# Patient Record
Sex: Male | Born: 2000 | Race: White | Hispanic: No | Marital: Single | Smoking: Never smoker
Health system: Southern US, Community
[De-identification: ages and names within clinical notes are randomized; demographics above are authoritative.]

## PROBLEM LIST (undated history)

## (undated) DIAGNOSIS — K429 Umbilical hernia without obstruction or gangrene: Secondary | ICD-10-CM

---
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
## 2018-06-26 ENCOUNTER — Other Ambulatory Visit: Payer: Self-pay | Admitting: Pediatrics

## 2018-06-26 DIAGNOSIS — Z136 Encounter for screening for cardiovascular disorders: Secondary | ICD-10-CM

## 2018-06-28 ENCOUNTER — Ambulatory Visit
Admission: RE | Admit: 2018-06-28 | Discharge: 2018-06-28 | Disposition: A | Discharge status: Home / Self Care | Payer: BC Managed Care – PPO | Source: Ambulatory Visit | Attending: Pediatrics | Admitting: Pediatrics

## 2018-06-28 DIAGNOSIS — Z136 Encounter for screening for cardiovascular disorders: Secondary | ICD-10-CM | POA: Diagnosis not present

## 2018-06-28 NOTE — Progress Notes (Signed)
*  PRELIMINARY RESULTS* Echocardiogram 2D Echocardiogram has been performed.  Cristela BlueHege, Roylene Heaton 06/28/2018, 11:49 AM

## 2019-02-15 ENCOUNTER — Emergency Department
Admission: EM | Admit: 2019-02-15 | Discharge: 2019-02-15 | Disposition: A | Discharge status: Home / Self Care | Payer: BC Managed Care – PPO | Attending: Student in an Organized Health Care Education/Training Program | Admitting: Student in an Organized Health Care Education/Training Program

## 2019-02-15 ENCOUNTER — Encounter: Payer: Self-pay | Admitting: Emergency Medicine

## 2019-02-15 ENCOUNTER — Emergency Department: Payer: BC Managed Care – PPO

## 2019-02-15 ENCOUNTER — Other Ambulatory Visit: Payer: Self-pay

## 2019-02-15 DIAGNOSIS — K429 Umbilical hernia without obstruction or gangrene: Secondary | ICD-10-CM | POA: Diagnosis not present

## 2019-02-15 DIAGNOSIS — R1084 Generalized abdominal pain: Secondary | ICD-10-CM | POA: Diagnosis present

## 2019-02-15 DIAGNOSIS — Z79899 Other long term (current) drug therapy: Secondary | ICD-10-CM | POA: Diagnosis not present

## 2019-02-15 LAB — URINALYSIS, COMPLETE (UACMP) WITH MICROSCOPIC
Bacteria, UA: NONE SEEN
Bilirubin Urine: NEGATIVE
Glucose, UA: NEGATIVE mg/dL
Hgb urine dipstick: NEGATIVE
Ketones, ur: NEGATIVE mg/dL
Leukocytes,Ua: NEGATIVE
Nitrite: NEGATIVE
Protein, ur: 30 mg/dL — AB
Specific Gravity, Urine: 1.024 (ref 1.005–1.030)
Squamous Epithelial / HPF: NONE SEEN (ref 0–5)
pH: 6 (ref 5.0–8.0)

## 2019-02-15 LAB — CBC
HCT: 46.6 % (ref 39.0–52.0)
Hemoglobin: 16.4 g/dL (ref 13.0–17.0)
MCH: 29.7 pg (ref 26.0–34.0)
MCHC: 35.2 g/dL (ref 30.0–36.0)
MCV: 84.3 fL (ref 80.0–100.0)
Platelets: 204 10*3/uL (ref 150–400)
RBC: 5.53 MIL/uL (ref 4.22–5.81)
RDW: 11.9 % (ref 11.5–15.5)
WBC: 6.9 10*3/uL (ref 4.0–10.5)
nRBC: 0 % (ref 0.0–0.2)

## 2019-02-15 LAB — COMPREHENSIVE METABOLIC PANEL
ALT: 13 U/L (ref 0–44)
AST: 19 U/L (ref 15–41)
Albumin: 5.6 g/dL — ABNORMAL HIGH (ref 3.5–5.0)
Alkaline Phosphatase: 114 U/L (ref 38–126)
Anion gap: 11 (ref 5–15)
BUN: 15 mg/dL (ref 6–20)
CO2: 26 mmol/L (ref 22–32)
Calcium: 9.9 mg/dL (ref 8.9–10.3)
Chloride: 104 mmol/L (ref 98–111)
Creatinine, Ser: 0.82 mg/dL (ref 0.61–1.24)
GFR calc Af Amer: >60 mL/min (ref >60)
GFR calc non Af Amer: >60 mL/min (ref >60)
Glucose, Bld: 156 mg/dL — ABNORMAL HIGH (ref 70–99)
Potassium: 3.9 mmol/L (ref 3.5–5.1)
Sodium: 141 mmol/L (ref 135–145)
Total Bilirubin: 0.7 mg/dL (ref 0.3–1.2)
Total Protein: 8.4 g/dL — ABNORMAL HIGH (ref 6.5–8.1)

## 2019-02-15 LAB — LIPASE, BLOOD: Lipase: 26 U/L (ref 11–51)

## 2019-02-15 MED ORDER — HYDROCODONE-ACETAMINOPHEN 5-325 MG PO TABS: 1.0000 | ORAL_TABLET | ORAL | 0 refills | Status: DC | PRN

## 2019-02-15 MED ORDER — POLYETHYLENE GLYCOL 3350 17 G PO PACK: 17.0000 g | PACK | Freq: Every day | ORAL | 0 refills | Status: DC

## 2019-02-15 NOTE — Discharge Instructions (Addendum)

## 2019-02-15 NOTE — ED Triage Notes (Signed)
Pt report pain just below umbilicus that started Wednesday; pt anxious in triage, says "i'm scared" as he's never been to the ED for himself before;  pt says pain is constant but varies in severity; some nausea, no vomiting or diarrhea; last bowel movement at 11pm last night-normal; reports urinary frequency but no pain;

## 2019-02-15 NOTE — ED Provider Notes (Signed)
[facility] Emergency Department Provider Note    First MD Initiated Contact with Patient 02/15/19 [phone]     (approximate)  I have reviewed the triage vital signs and the nursing notes.   HISTORY  Chief Complaint Abdominal Pain    HPI Kyle Clarke is a 18 y.o. male no significant past medical history presents the ER for 2 days of progressively worsening periumbilical pain that does radiate to the epigastric region as well as right lower quadrant.  Patient is never had pain like this before.  States it is worsened with palpation of the periumbilical area.  Describes it as a burning sensation.  Denies any fevers.  No nausea or vomiting.  No diarrhea.  No constipation.  No previous surgeries.    History reviewed. No pertinent past medical history. History reviewed. No pertinent family history. History reviewed. No pertinent surgical history. Patient Active Problem List   Diagnosis Date Noted  . Right ankle pain 04/07/2016      Prior to Admission medications   Medication Sig Start Date End Date Taking? Authorizing Provider  famotidine (PEPCID) 10 MG tablet Take 10 mg by mouth as needed for heartburn or indigestion.   Yes [provider]  hyoscyamine (LEVSIN SL) 0.125 MG SL tablet Take 1-2 tablets by mouth every 6 (six) hours. 02/06/19  Yes [provider]  omeprazole (PRILOSEC) 20 MG capsule Take 20 mg by mouth as needed.   Yes [provider]  HYDROcodone-acetaminophen (NORCO) 5-325 MG tablet Take 1 tablet by mouth every 4 (four) hours as needed for moderate pain. 02/15/19   Willy Eddy, MD  polyethylene glycol (MIRALAX / GLYCOLAX) 17 g packet Take 17 g by mouth daily. Mix one tablespoon with 8oz of your favorite juice or water every day until you are having soft formed stools. Then start taking once daily if you didn't have a stool the day before. 02/15/19   Willy Eddy, MD    Allergies Patient has no known  allergies.    Social History Social History   Tobacco Use  . Smoking status: Never Smoker  . Smokeless tobacco: Never Used  Substance Use Topics  . Alcohol use: Never    Alcohol/week: 0.0 standard drinks    Frequency: Never  . Drug use: Never    Review of Systems Patient denies headaches, rhinorrhea, blurry vision, numbness, shortness of breath, chest pain, edema, cough, abdominal pain, nausea, vomiting, diarrhea, dysuria, fevers, rashes or hallucinations unless otherwise stated above in HPI. ____________________________________________   PHYSICAL EXAM:  VITAL SIGNS: Vitals:   02/15/19 0358  BP: (!) 157/96  Pulse: (!) 127  Resp: 19  Temp: 97.8 F (36.6 C)  SpO2: 100%    Constitutional: Alert and oriented.  Eyes: Conjunctivae are normal.  Head: Atraumatic. Nose: No congestion/rhinnorhea. Mouth/Throat: Mucous membranes are moist.   Neck: No stridor. Painless ROM.  Cardiovascular: Normal rate, regular rhythm. Grossly normal heart sounds.  Good peripheral circulation. Respiratory: Normal respiratory effort.  No retractions. Lungs CTAB. Gastrointestinal: Soft and tender small infraumbilical reducible hernia.  No overlying erythema.  No tenderness on remainder of abdominal exam. . No distention. No abdominal bruits. No CVA tenderness. Genitourinary: deferred Musculoskeletal: No lower extremity tenderness nor edema.  No joint effusions. Neurologic:  Normal speech and language. No gross focal neurologic deficits are appreciated. No facial droop Skin:  Skin is warm, dry and intact. No rash noted. Psychiatric: Mood and affect are anxious Speech and behavior are normal.  ____________________________________________  LABS (all labs ordered are listed, but only abnormal results are displayed)  Results for orders placed or performed during the hospital encounter of 02/15/19 (from the past 24 hour(s))  Lipase, blood     Status: None   Collection Time: 02/15/19  4:02 AM   Result Value Ref Range   Lipase 26 11 - 51 U/L  Comprehensive metabolic panel     Status: Abnormal   Collection Time: 02/15/19  4:02 AM  Result Value Ref Range   Sodium 141 135 - 145 mmol/L   Potassium 3.9 3.5 - 5.1 mmol/L   Chloride 104 98 - 111 mmol/L   CO2 26 22 - 32 mmol/L   Glucose, Bld 156 (H) 70 - 99 mg/dL   BUN 15 6 - 20 mg/dL   Creatinine, Ser 1.61 0.61 - 1.24 mg/dL   Calcium 9.9 8.9 - 09.6 mg/dL   Total Protein 8.4 (H) 6.5 - 8.1 g/dL   Albumin 5.6 (H) 3.5 - 5.0 g/dL   AST 19 15 - 41 U/L   ALT 13 0 - 44 U/L   Alkaline Phosphatase 114 38 - 126 U/L   Total Bilirubin 0.7 0.3 - 1.2 mg/dL   GFR calc non Af Amer >60 >60 mL/min   GFR calc Af Amer >60 >60 mL/min   Anion gap 11 5 - 15  CBC     Status: None   Collection Time: 02/15/19  4:02 AM  Result Value Ref Range   WBC 6.9 4.0 - 10.5 K/uL   RBC 5.53 4.22 - 5.81 MIL/uL   Hemoglobin 16.4 13.0 - 17.0 g/dL   HCT 04.5 40.9 - 81.1 %   MCV 84.3 80.0 - 100.0 fL   MCH 29.7 26.0 - 34.0 pg   MCHC 35.2 30.0 - 36.0 g/dL   RDW 91.4 78.2 - 95.6 %   Platelets 204 150 - 400 K/uL   nRBC 0.0 0.0 - 0.2 %  Urinalysis, Complete w Microscopic     Status: Abnormal   Collection Time: 02/15/19  4:08 AM  Result Value Ref Range   Color, Urine YELLOW (A) YELLOW   APPearance CLEAR (A) CLEAR   Specific Gravity, Urine 1.024 1.005 - 1.030   pH 6.0 5.0 - 8.0   Glucose, UA NEGATIVE NEGATIVE mg/dL   Hgb urine dipstick NEGATIVE NEGATIVE   Bilirubin Urine NEGATIVE NEGATIVE   Ketones, ur NEGATIVE NEGATIVE mg/dL   Protein, ur 30 (A) NEGATIVE mg/dL   Nitrite NEGATIVE NEGATIVE   Leukocytes,Ua NEGATIVE NEGATIVE   RBC / HPF 0-5 0 - 5 RBC/hpf   WBC, UA 0-5 0 - 5 WBC/hpf   Bacteria, UA NONE SEEN NONE SEEN   Squamous Epithelial / LPF NONE SEEN 0 - 5   Mucus PRESENT    ____________________________________________ ____________________________________________  RADIOLOGY  I personally reviewed all radiographic images ordered to evaluate for the above  acute complaints and reviewed radiology reports and findings.  These findings were personally discussed with the patient.  Please see medical record for radiology report.  ____________________________________________   PROCEDURES  Procedure(s) performed:  Procedures    Critical Care performed: no ____________________________________________   INITIAL IMPRESSION / ASSESSMENT AND PLAN / ED COURSE  Pertinent labs & imaging results that were available during my care of the patient were reviewed by me and considered in my medical decision making (see chart for details).   DDX: Hernia, appendicitis, musculoskeletal strain, colitis, constipation, stone  Kyle Clarke is a 18 y.o. who presents to the ED  with symptoms as described above.  Patient nontoxic-appearing ambulating about the room in no acute distress.  Certainly no peritonitis.  Exam is concerning for periumbilical hernia that is reducible.  Will order ultrasound as he does have some report of right lower quadrant referred pain.  Given lack of leukocytosis do not feel that CT imaging clinically indicated as I would expect some form of fever or white count elevation after 2 days of pain if this were related to appendicitis.  Clinical Course as of Feb 15 603  Sat Feb 15, 2019  0604 Repeat abdominal exam soft benign.  Ultrasound does show evidence of small fat-containing hernia that is reducible.  No evidence of SBO.  Not consistent with acute appendicitis.  Patient will be given symptomatic management as well as referral to surgery.  Discussed signs and symptoms for which she should return to the ER.   [PR]    Clinical Course User Index [PR] Willy Eddyobinson, Burnett Lieber, MD    The patient was evaluated in Emergency Department today for the symptoms described in the history of present illness. He/she was evaluated in the context of the global COVID-19 pandemic, which necessitated consideration that the patient might be at risk for infection  with the SARS-CoV-2 virus that causes COVID-19. Institutional protocols and algorithms that pertain to the evaluation of patients at risk for COVID-19 are in a state of rapid change based on information released by regulatory bodies including the CDC and federal and state organizations. These policies and algorithms were followed during the patient's care in the ED.  As part of my medical decision making, I reviewed the following data within the electronic MEDICAL RECORD NUMBER Nursing notes reviewed and incorporated, Labs reviewed, notes from prior ED visits and [place] Controlled Substance Database   ____________________________________________   FINAL CLINICAL IMPRESSION(S) / ED DIAGNOSES  Final diagnoses:  Periumbilical hernia  Generalized abdominal pain      NEW MEDICATIONS STARTED DURING THIS VISIT:  New Prescriptions   HYDROCODONE-ACETAMINOPHEN (NORCO) 5-325 MG TABLET    Take 1 tablet by mouth every 4 (four) hours as needed for moderate pain.   POLYETHYLENE GLYCOL (MIRALAX / GLYCOLAX) 17 G PACKET    Take 17 g by mouth daily. Mix one tablespoon with 8oz of your favorite juice or water every day until you are having soft formed stools. Then start taking once daily if you didn't have a stool the day before.     Note:  This document was prepared using Dragon voice recognition software and may include unintentional dictation errors.    Willy Eddyobinson, Jeffery Gammell, MD 02/15/19 [phone]

## 2019-02-15 NOTE — ED Notes (Signed)
Pt says he's unable to void even just a little at this time as he went before coming to the ED; given specimen cup and encouraged to go as soon as possible

## 2019-02-17 ENCOUNTER — Other Ambulatory Visit: Payer: Self-pay

## 2019-02-17 ENCOUNTER — Encounter: Payer: Self-pay | Admitting: Anesthesiology

## 2019-02-17 ENCOUNTER — Ambulatory Visit: Payer: BC Managed Care – PPO | Admitting: Anesthesiology

## 2019-02-17 ENCOUNTER — Ambulatory Visit
Admission: RE | Admit: 2019-02-17 | Discharge: 2019-02-17 | Disposition: A | Discharge status: Home / Self Care | Payer: BC Managed Care – PPO | Source: Ambulatory Visit | Attending: Surgery | Admitting: Surgery

## 2019-02-17 ENCOUNTER — Ambulatory Visit: Payer: Self-pay | Admitting: Surgery

## 2019-02-17 ENCOUNTER — Encounter
Admission: RE | Disposition: A | Discharge status: Home / Self Care | Payer: Self-pay | Source: Ambulatory Visit | Attending: Surgery

## 2019-02-17 DIAGNOSIS — K421 Umbilical hernia with gangrene: Secondary | ICD-10-CM | POA: Insufficient documentation

## 2019-02-17 DIAGNOSIS — Z79899 Other long term (current) drug therapy: Secondary | ICD-10-CM | POA: Insufficient documentation

## 2019-02-17 DIAGNOSIS — K420 Umbilical hernia with obstruction, without gangrene: Secondary | ICD-10-CM

## 2019-02-17 HISTORY — DX: Umbilical hernia without obstruction or gangrene: K42.9

## 2019-02-17 HISTORY — PX: UMBILICAL HERNIA REPAIR: SHX196

## 2019-02-17 SURGERY — REPAIR, HERNIA, UMBILICAL, ADULT
Anesthesia: General

## 2019-02-17 MED ORDER — SUGAMMADEX SODIUM 200 MG/2ML IV SOLN
INTRAVENOUS | Status: DC | PRN
  Administered 2019-02-17: 140 mg via INTRAVENOUS

## 2019-02-17 MED ORDER — IBUPROFEN 800 MG PO TABS: 800.0000 mg | ORAL_TABLET | Freq: Three times a day (TID) | ORAL | 0 refills | Status: DC | PRN

## 2019-02-17 MED ORDER — LACTATED RINGERS IV SOLN
INTRAVENOUS | Status: DC
  Administered 2019-02-17: 19:00:00 via INTRAVENOUS

## 2019-02-17 MED ORDER — BUPIVACAINE-EPINEPHRINE (PF) 0.5% -1:200000 IJ SOLN
INTRAMUSCULAR | Status: DC | PRN
  Administered 2019-02-17: 3 mL via PERINEURAL

## 2019-02-17 MED ORDER — ONDANSETRON HCL 4 MG/2ML IJ SOLN: 4.0000 mg | Freq: Once | INTRAMUSCULAR | Status: DC | PRN

## 2019-02-17 MED ORDER — BUPIVACAINE-EPINEPHRINE (PF) 0.5% -1:200000 IJ SOLN
INTRAMUSCULAR | Status: AC
  Filled 2019-02-17: qty 30

## 2019-02-17 MED ORDER — GABAPENTIN 300 MG PO CAPS: 300.0000 mg | ORAL_CAPSULE | ORAL | Status: DC

## 2019-02-17 MED ORDER — DEXAMETHASONE SODIUM PHOSPHATE 10 MG/ML IJ SOLN
INTRAMUSCULAR | Status: DC | PRN
  Administered 2019-02-17: 10 mg via INTRAVENOUS

## 2019-02-17 MED ORDER — DOCUSATE SODIUM 100 MG PO CAPS: 100.0000 mg | ORAL_CAPSULE | Freq: Two times a day (BID) | ORAL | 0 refills | Status: AC | PRN

## 2019-02-17 MED ORDER — CEFAZOLIN SODIUM-DEXTROSE 2-4 GM/100ML-% IV SOLN
INTRAVENOUS | Status: AC
  Filled 2019-02-17: qty 100

## 2019-02-17 MED ORDER — BUPIVACAINE LIPOSOME 1.3 % IJ SUSP
INTRAMUSCULAR | Status: DC | PRN
  Administered 2019-02-17: 20 mL

## 2019-02-17 MED ORDER — HYDROCODONE-ACETAMINOPHEN 5-325 MG PO TABS: 1.0000 | ORAL_TABLET | Freq: Four times a day (QID) | ORAL | 0 refills | Status: AC | PRN

## 2019-02-17 MED ORDER — BUPIVACAINE HCL (PF) 0.5 % IJ SOLN
INTRAMUSCULAR | Status: AC
  Filled 2019-02-17: qty 30

## 2019-02-17 MED ORDER — MIDAZOLAM HCL 2 MG/2ML IJ SOLN
INTRAMUSCULAR | Status: DC | PRN
  Administered 2019-02-17: 2 mg via INTRAVENOUS

## 2019-02-17 MED ORDER — CELECOXIB 200 MG PO CAPS
ORAL_CAPSULE | ORAL | Status: AC
  Filled 2019-02-17: qty 1

## 2019-02-17 MED ORDER — SUCCINYLCHOLINE CHLORIDE 20 MG/ML IJ SOLN
INTRAMUSCULAR | Status: DC | PRN
  Administered 2019-02-17: 100 mg via INTRAVENOUS

## 2019-02-17 MED ORDER — BUPIVACAINE LIPOSOME 1.3 % IJ SUSP
INTRAMUSCULAR | Status: AC
  Filled 2019-02-17: qty 20

## 2019-02-17 MED ORDER — ONDANSETRON HCL 4 MG/2ML IJ SOLN
INTRAMUSCULAR | Status: DC | PRN
  Administered 2019-02-17: 4 mg via INTRAVENOUS

## 2019-02-17 MED ORDER — LIDOCAINE HCL (CARDIAC) PF 100 MG/5ML IV SOSY
PREFILLED_SYRINGE | INTRAVENOUS | Status: DC | PRN
  Administered 2019-02-17: 100 mg via INTRAVENOUS

## 2019-02-17 MED ORDER — DEXAMETHASONE SODIUM PHOSPHATE 10 MG/ML IJ SOLN
INTRAMUSCULAR | Status: AC
  Filled 2019-02-17: qty 1

## 2019-02-17 MED ORDER — CELECOXIB 200 MG PO CAPS
200.0000 mg | ORAL_CAPSULE | ORAL | Status: AC
  Administered 2019-02-17: 200 mg via ORAL

## 2019-02-17 MED ORDER — ACETAMINOPHEN 500 MG PO TABS
1000.0000 mg | ORAL_TABLET | ORAL | Status: AC
  Administered 2019-02-17: 17:00:00 1000 mg via ORAL

## 2019-02-17 MED ORDER — FENTANYL CITRATE (PF) 100 MCG/2ML IJ SOLN
INTRAMUSCULAR | Status: AC
  Filled 2019-02-17: qty 2

## 2019-02-17 MED ORDER — FENTANYL CITRATE (PF) 100 MCG/2ML IJ SOLN: 25.0000 ug | INTRAMUSCULAR | Status: DC | PRN

## 2019-02-17 MED ORDER — PROPOFOL 10 MG/ML IV BOLUS
INTRAVENOUS | Status: DC | PRN
  Administered 2019-02-17: 150 mg via INTRAVENOUS

## 2019-02-17 MED ORDER — GABAPENTIN 300 MG PO CAPS
ORAL_CAPSULE | ORAL | Status: AC
  Administered 2019-02-17: 17:00:00 300 mg
  Filled 2019-02-17: qty 1

## 2019-02-17 MED ORDER — SUGAMMADEX SODIUM 200 MG/2ML IV SOLN
INTRAVENOUS | Status: AC
  Filled 2019-02-17: qty 2

## 2019-02-17 MED ORDER — ACETAMINOPHEN 325 MG PO TABS: 650.0000 mg | ORAL_TABLET | Freq: Three times a day (TID) | ORAL | 0 refills | Status: AC | PRN

## 2019-02-17 MED ORDER — FENTANYL CITRATE (PF) 100 MCG/2ML IJ SOLN
INTRAMUSCULAR | Status: DC | PRN
  Administered 2019-02-17 (×2): 50 ug via INTRAVENOUS

## 2019-02-17 MED ORDER — ACETAMINOPHEN 500 MG PO TABS
ORAL_TABLET | ORAL | Status: AC
  Filled 2019-02-17: qty 2

## 2019-02-17 MED ORDER — ONDANSETRON HCL 4 MG/2ML IJ SOLN
INTRAMUSCULAR | Status: AC
  Filled 2019-02-17: qty 2

## 2019-02-17 MED ORDER — CHLORHEXIDINE GLUCONATE CLOTH 2 % EX PADS: 6.0000 | MEDICATED_PAD | Freq: Once | CUTANEOUS | Status: DC

## 2019-02-17 MED ORDER — FAMOTIDINE 20 MG PO TABS
ORAL_TABLET | ORAL | Status: AC
  Filled 2019-02-17: qty 1

## 2019-02-17 MED ORDER — ROCURONIUM BROMIDE 100 MG/10ML IV SOLN
INTRAVENOUS | Status: DC | PRN
  Administered 2019-02-17: 40 mg via INTRAVENOUS

## 2019-02-17 MED ORDER — MIDAZOLAM HCL 2 MG/2ML IJ SOLN
INTRAMUSCULAR | Status: AC
  Filled 2019-02-17: qty 2

## 2019-02-17 MED ORDER — CEFAZOLIN SODIUM-DEXTROSE 2-4 GM/100ML-% IV SOLN
2.0000 g | INTRAVENOUS | Status: AC
  Administered 2019-02-17: 19:00:00 2 g via INTRAVENOUS

## 2019-02-17 MED ORDER — FAMOTIDINE 20 MG PO TABS
20.0000 mg | ORAL_TABLET | Freq: Once | ORAL | Status: AC
  Administered 2019-02-17: 20 mg via ORAL

## 2019-02-17 SURGICAL SUPPLY — 34 items
ADH SKN CLS APL DERMABOND .7 (GAUZE/BANDAGES/DRESSINGS) ×1
APL PRP STRL LF DISP 70% ISPRP (MISCELLANEOUS) ×1
BLADE SURG 15 STRL LF DISP TIS (BLADE) ×1 IMPLANT
BLADE SURG 15 STRL SS (BLADE) ×2
CANISTER SUCT 1200ML W/VALVE (MISCELLANEOUS) ×2 IMPLANT
CHLORAPREP W/TINT 26 (MISCELLANEOUS) ×2 IMPLANT
COVER WAND RF STERILE (DRAPES) ×1 IMPLANT
DERMABOND ADVANCED (GAUZE/BANDAGES/DRESSINGS) ×1
DERMABOND ADVANCED .7 DNX12 (GAUZE/BANDAGES/DRESSINGS) ×1 IMPLANT
DRAPE LAPAROTOMY 77X122 PED (DRAPES) ×2 IMPLANT
ELECT REM PT RETURN 9FT ADLT (ELECTROSURGICAL) ×2
ELECTRODE REM PT RTRN 9FT ADLT (ELECTROSURGICAL) ×1 IMPLANT
GLOVE BIOGEL PI IND STRL 7.0 (GLOVE) ×1 IMPLANT
GLOVE BIOGEL PI INDICATOR 7.0 (GLOVE) ×3
GLOVE SURG SYN 6.5 ES PF (GLOVE) ×6 IMPLANT
GLOVE SURG SYN 6.5 PF PI (GLOVE) ×1 IMPLANT
GOWN STRL REUS W/ TWL LRG LVL3 (GOWN DISPOSABLE) ×3 IMPLANT
GOWN STRL REUS W/TWL LRG LVL3 (GOWN DISPOSABLE) ×6
KIT TURNOVER KIT A (KITS) ×2 IMPLANT
LABEL OR SOLS (LABEL) ×2 IMPLANT
NEEDLE HYPO 22GX1.5 SAFETY (NEEDLE) ×2 IMPLANT
NS IRRIG 500ML POUR BTL (IV SOLUTION) ×2 IMPLANT
PACK BASIN MINOR ARMC (MISCELLANEOUS) ×2 IMPLANT
SUT ETHIBOND NAB MO 7 #0 18IN (SUTURE) ×2 IMPLANT
SUT MNCRL 4-0 (SUTURE) ×2
SUT MNCRL 4-0 27XMFL (SUTURE) ×1
SUT VIC AB 2-0 SH 27 (SUTURE) ×2
SUT VIC AB 2-0 SH 27XBRD (SUTURE) ×1 IMPLANT
SUT VIC AB 3-0 SH 27 (SUTURE) ×2
SUT VIC AB 3-0 SH 27X BRD (SUTURE) ×1 IMPLANT
SUTURE MNCRL 4-0 27XMF (SUTURE) ×1 IMPLANT
SYR 10ML LL (SYRINGE) ×4 IMPLANT
TOWEL OR 17X26 4PK STRL BLUE (TOWEL DISPOSABLE) ×2 IMPLANT
WATER STERILE IRR 1000ML POUR (IV SOLUTION) ×1 IMPLANT

## 2019-02-17 NOTE — H&P (View-Only) (Signed)
Subjective:   CC: Umbilical hernia with gangrene and obstruction [K42.1]  HPI:  Kyle Clarke is a 17 y.o. male who was referred by Roxan Hockey for evaluation of above. Symptoms were first noted 5 days ago. Pain is sharp and intense, confined to the umbilicus, without radiation.  Associated with nothing specific, exacerbated by sitting  Lump is not noticeable. Patient has no symptoms of  difficulty urinating. No other associated symptoms.   He has always had a sensitive umbilicus and point tenderness in the area has been an issue for years.  This most recent episode is the worst and seems to not get any better even with narcotics.  Past Medical History: none reported  Past Surgical History: none reported  Family History: family history includes Diabetes in his father; High blood pressure (Hypertension) in his father, paternal grandfather, and paternal grandmother; No Known Problems in his mother; Skin cancer in his father and paternal grandfather.  Social History:  reports that he has never smoked. He has never used smokeless tobacco. He reports that he does not drink alcohol. No history on file for drug.  Current Medications: has a current medication list which includes the following prescription(s): hydrocodone-acetaminophen and hyoscyamine.  Allergies:  Allergies as of 02/17/2019  . (No Known Allergies)    ROS:  General: Denies weight loss, weight gain, fatigue, fevers, chills, and night sweats. Eyes: Denies blurry vision, double vision, eye pain, itchy eyes, and tearing. Ears: Denies hearing loss, earache, and ringing in ears. Nose: Denies sinus pain, congestion, infections, runny nose, and nosebleeds. Mouth/throat: Denies hoarseness, sore throat, bleeding gums, and difficulty swallowing. Heart: Denies chest pain, palpitations, racing heart, irregular heartbeat, leg pain or swelling, and decreased activity tolerance. Respiratory: Denies breathing difficulty, shortness of  breath, wheezing, cough, and sputum. GI: Denies change in appetite, heartburn, nausea, vomiting, constipation, diarrhea, and blood in stool. GU: Denies difficulty urinating, pain with urinating, urgency, frequency, blood in urine, and heavy menstrual bleeding. Musculoskeletal: Denies joint stiffness, pain, swelling, muscle weakness, and pain. Skin: Denies rash, itching, mass, tumors, sores, and boils Neurologic: Denies headache, fainting, dizziness, seizures, numbness, and tingling. Psychiatric: Denies depression, anxiety, difficulty sleeping, and memory loss. Endocrine: Denies heat or cold intolerance, and increased thirst or urination. Blood/lymph: Denies easy bruising, easy bruising, and swollen glands   Objective:   BP 129/79   Ht 185.4 cm (6\' 1" )   Wt 68 kg (149 lb 14.6 oz)   BMI 19.78 kg/m   Constitutional :  alert, appears stated age, cooperative and no distress  Lymphatics/Throat:  no asymmetry, masses, or scars  Respiratory:  clear to auscultation bilaterally  Cardiovascular:  regular rate and rhythm  Gastrointestinal: surrounding erythema at umbilicus, with very tender nodule on right aspect that is irreducible, consistent with incarcerated, likely strangulated umbilical hernia. Remaining abdomen is non-tender, soft  Musculoskeletal: Steady gait and movement  Skin: Cool and moist  Psychiatric: Normal affect, non-agitated, not confused       LABS:  n/a   RADS: CLINICAL DATA: Periumbilical pain. Evaluate for hernia.  EXAM: ULTRASOUND ABDOMEN LIMITED  COMPARISON: None.  FINDINGS: Focused ultrasound of the paraumbilical region demonstrates a tiny fat containing hernia just inferior to the umbilicus. No herniated bowel.  IMPRESSION: 1. Tiny fat containing paraumbilical hernia.   Electronically Signed By: Obie Dredge M.D. On: 02/15/2019 05:53  Assessment:       Umbilical hernia with gangrene and obstruction [K42.1]  Plan:    1. Umbilical hernia with gangrene and obstruction [Z61.1]  Discussed the risk of surgery including recurrence, which can be up to 50% in the case of incisional or complex hernias, possible use of prosthetic materials (mesh) and the increased risk of mesh infxn if used, bleeding, chronic pain, post-op infxn, post-op SBO or ileus, and possible re-operation to address said risks. The risks of general anesthetic, if used, includes MI, CVA, sudden death or even reaction to anesthetic medications also discussed. Alternatives include continued observation.  Benefits include possible symptom relief, prevention of incarceration, strangulation, enlargement in size over time, and the risk of emergency surgery in the face of strangulation.   Typical post-op recovery time of 3-5 days with 4-6 weeks of activity restrictions were also discussed.  ED return precautions given for sudden increase in pain, size of hernia with accompanying fever, nausea, and/or vomiting.  The patient verbalized understanding and all questions were answered to the patient's satisfaction.   2. Patient has elected to proceed with surgical treatment. Procedure will be urgently scheduled.       Electronically signed by Sung AmabileSakai, Amauri Medellin, DO on 02/17/2019 2:23 PM

## 2019-02-17 NOTE — Anesthesia Preprocedure Evaluation (Signed)
Anesthesia Evaluation  Patient identified by MRN, date of birth, ID band Patient awake    Reviewed: Allergy & Precautions, NPO status , Patient's Chart, lab work & pertinent test results, reviewed documented beta blocker date and time   Airway Mallampati: II  TM Distance: >3 FB     Dental  (+) Chipped   Pulmonary           Cardiovascular      Neuro/Psych    GI/Hepatic   Endo/Other    Renal/GU      Musculoskeletal   Abdominal   Peds  Hematology   Anesthesia Other Findings   Reproductive/Obstetrics                             Anesthesia Physical Anesthesia Plan  ASA: II  Anesthesia Plan: General   Post-op Pain Management:    Induction: Intravenous  PONV Risk Score and Plan:   Airway Management Planned: LMA  Additional Equipment:   Intra-op Plan:   Post-operative Plan:   Informed Consent: I have reviewed the patients History and Physical, chart, labs and discussed the procedure including the risks, benefits and alternatives for the proposed anesthesia with the patient or authorized representative who has indicated his/her understanding and acceptance.       Plan Discussed with: CRNA  Anesthesia Plan Comments:         Anesthesia Quick Evaluation  

## 2019-02-17 NOTE — Discharge Instructions (Signed)
AMBULATORY SURGERY  DISCHARGE INSTRUCTIONS   1) The drugs that you were given will stay in your system until tomorrow so for the next 24 hours you should not:  A) Drive an automobile B) Make any legal decisions C) Drink any alcoholic beverage   2) You may resume regular meals tomorrow.  Today it is better to start with liquids and gradually work up to solid foods.  You may eat anything you prefer, but it is better to start with liquids, then soup and crackers, and gradually work up to solid foods.   3) Please notify your doctor immediately if you have any unusual bleeding, trouble breathing, redness and pain at the surgery site, drainage, fever, or pain not relieved by medication. 4)   5) Your post-operative visit with Dr.                                     is: Date:                        Time:    Please call to schedule your post-operative visit.  6) Additional Instructions:        Hernia repair, Care After This sheet gives you information about how to care for yourself after your procedure. Your health care provider may also give you more specific instructions. If you have problems or questions, contact your health care provider. What can I expect after the procedure? After your procedure, it is common to have the following:  Pain in your abdomen, especially in the incision areas. You will be given medicine to control the pain.  Tiredness. This is a normal part of the recovery process. Your energy level will return to normal over the next several weeks.  Changes in your bowel movements, such as constipation or needing to go more often. Talk with your health care provider about how to manage this. Follow these instructions at home: Medicines   tylenol and advil as needed for discomfort.  Please alternate between the two every four hours as needed for pain.     Use narcotics, if prescribed, only when tylenol and motrin is not enough to control pain.   325-650mg   every 8hrs to max of 4000mg /24hrs (including the 325mg  in every norco dose) for the tylenol.     Advil up to 800mg  per dose every 8hrs as needed for pain.    Do not drive or use heavy machinery while taking prescription pain medicine.  Do not drink alcohol while taking prescription pain medicine.  Incision care     Follow instructions from your health care provider about how to take care of your incision areas. Make sure you: ? Keep your incisions clean and dry. ? Wash your hands with soap and water before and after applying medicine to the areas, and before and after changing your bandage (dressing). If soap and water are not available, use hand sanitizer. ? Change your dressing as told by your health care provider. ? Leave stitches (sutures), skin glue, or adhesive strips in place. These skin closures may need to stay in place for 2 weeks or longer. If adhesive strip edges start to loosen and curl up, you may trim the loose edges. Do not remove adhesive strips completely unless your health care provider tells you to do that.  Do not wear tight clothing over the incisions. Tight clothing may rub  and irritate the incision areas, which may cause the incisions to open.  Do not take baths, swim, or use a hot tub until your health care provider approves. OK TO SHOWER IN 24HRS.    Check your incision area every day for signs of infection. Check for: ? More redness, swelling, or pain. ? More fluid or blood. ? Warmth. ? Pus or a bad smell. Activity  Avoid lifting anything that is heavier than 10 lb (4.5 kg) for 2 weeks or until your health care provider says it is okay.  You may resume normal activities as told by your health care provider. Ask your health care provider what activities are safe for you.  Take rest breaks during the day as needed. Eating and drinking  Follow instructions from your health care provider about what you can eat after surgery.  To prevent or treat  constipation while you are taking prescription pain medicine, your health care provider may recommend that you: ? Drink enough fluid to keep your urine clear or pale yellow. ? Take over-the-counter or prescription medicines. ? Eat foods that are high in fiber, such as fresh fruits and vegetables, whole grains, and beans. ? Limit foods that are high in fat and processed sugars, such as fried and sweet foods. General instructions  Ask your health care provider when you will need an appointment to get your sutures or staples removed.  Keep all follow-up visits as told by your health care provider. This is important. Contact a health care provider if:  You have more redness, swelling, or pain around your incisions.  You have more fluid or blood coming from the incisions.  Your incisions feel warm to the touch.  You have pus or a bad smell coming from your incisions or your dressing.  You have a fever.  You have an incision that breaks open (edges not staying together) after sutures or staples have been removed. Get help right away if:  You develop a rash.  You have chest pain or difficulty breathing.  You have pain or swelling in your legs.  You feel light-headed or you faint.  Your abdomen swells (becomes distended).  You have nausea or vomiting.  You have blood in your stool (feces). This information is not intended to replace advice given to you by your health care provider. Make sure you discuss any questions you have with your health care provider. Document Released: 04/21/2005 Document Revised: 06/21/2018 Document Reviewed: 07/03/2016 Elsevier Interactive Patient Education  2019 [employer].

## 2019-02-17 NOTE — Anesthesia Procedure Notes (Signed)
Procedure Name: Intubation Date/Time: 02/17/2019 6:59 PM Performed by: Lendon Colonel, CRNA Pre-anesthesia Checklist: Patient identified, Patient being monitored, Timeout performed, Emergency Drugs available and Suction available Patient Re-evaluated:Patient Re-evaluated prior to induction Oxygen Delivery Method: Circle system utilized Preoxygenation: Pre-oxygenation with 100% oxygen Induction Type: IV induction Ventilation: Mask ventilation without difficulty Laryngoscope Size: Mac and 3 Grade View: Grade I Tube type: Oral Tube size: 7.5 mm Number of attempts: 1 Airway Equipment and Method: Stylet Placement Confirmation: ETT inserted through vocal cords under direct vision,  positive ETCO2 and breath sounds checked- equal and bilateral Secured at: 22 cm Tube secured with: Tape Dental Injury: Teeth and Oropharynx as per pre-operative assessment

## 2019-02-17 NOTE — Anesthesia Postprocedure Evaluation (Signed)
Anesthesia Post Note  Patient: Kyle Clarke  Procedure(s) Performed: HERNIA REPAIR UMBILICAL ADULT (N/A )  Patient location during evaluation: PACU Anesthesia Type: General Level of consciousness: awake and alert Pain management: pain level controlled Vital Signs Assessment: post-procedure vital signs reviewed and stable Respiratory status: spontaneous breathing, nonlabored ventilation, respiratory function stable and patient connected to nasal cannula oxygen Cardiovascular status: blood pressure returned to baseline and stable Postop Assessment: no apparent nausea or vomiting Anesthetic complications: no     Last Vitals:  Vitals:   02/17/19 2041 02/17/19 2047  BP: (!) 138/95 (!) 133/92  Pulse: 84 92  Resp: 15 18  Temp: 37.1 C 37.1 C  SpO2: 98% 99%    Last Pain:  Vitals:   02/17/19 2047  TempSrc: Temporal  PainSc: 2                  Jakeia Carreras S

## 2019-02-17 NOTE — H&P (Signed)
Subjective:   CC: Umbilical hernia with gangrene and obstruction [K42.1]  HPI:  Kyle Clarke is a 17 y.o. male who was referred by Roxan Hockey for evaluation of above. Symptoms were first noted 5 days ago. Pain is sharp and intense, confined to the umbilicus, without radiation.  Associated with nothing specific, exacerbated by sitting  Lump is not noticeable. Patient has no symptoms of  difficulty urinating. No other associated symptoms.   He has always had a sensitive umbilicus and point tenderness in the area has been an issue for years.  This most recent episode is the worst and seems to not get any better even with narcotics.  Past Medical History: none reported  Past Surgical History: none reported  Family History: family history includes Diabetes in his father; High blood pressure (Hypertension) in his father, paternal grandfather, and paternal grandmother; No Known Problems in his mother; Skin cancer in his father and paternal grandfather.  Social History:  reports that he has never smoked. He has never used smokeless tobacco. He reports that he does not drink alcohol. No history on file for drug.  Current Medications: has a current medication list which includes the following prescription(s): hydrocodone-acetaminophen and hyoscyamine.  Allergies:  Allergies as of 02/17/2019  . (No Known Allergies)    ROS:  General: Denies weight loss, weight gain, fatigue, fevers, chills, and night sweats. Eyes: Denies blurry vision, double vision, eye pain, itchy eyes, and tearing. Ears: Denies hearing loss, earache, and ringing in ears. Nose: Denies sinus pain, congestion, infections, runny nose, and nosebleeds. Mouth/throat: Denies hoarseness, sore throat, bleeding gums, and difficulty swallowing. Heart: Denies chest pain, palpitations, racing heart, irregular heartbeat, leg pain or swelling, and decreased activity tolerance. Respiratory: Denies breathing difficulty, shortness of  breath, wheezing, cough, and sputum. GI: Denies change in appetite, heartburn, nausea, vomiting, constipation, diarrhea, and blood in stool. GU: Denies difficulty urinating, pain with urinating, urgency, frequency, blood in urine, and heavy menstrual bleeding. Musculoskeletal: Denies joint stiffness, pain, swelling, muscle weakness, and pain. Skin: Denies rash, itching, mass, tumors, sores, and boils Neurologic: Denies headache, fainting, dizziness, seizures, numbness, and tingling. Psychiatric: Denies depression, anxiety, difficulty sleeping, and memory loss. Endocrine: Denies heat or cold intolerance, and increased thirst or urination. Blood/lymph: Denies easy bruising, easy bruising, and swollen glands   Objective:   BP 129/79   Ht 185.4 cm (6\' 1" )   Wt 68 kg (149 lb 14.6 oz)   BMI 19.78 kg/m   Constitutional :  alert, appears stated age, cooperative and no distress  Lymphatics/Throat:  no asymmetry, masses, or scars  Respiratory:  clear to auscultation bilaterally  Cardiovascular:  regular rate and rhythm  Gastrointestinal: surrounding erythema at umbilicus, with very tender nodule on right aspect that is irreducible, consistent with incarcerated, likely strangulated umbilical hernia. Remaining abdomen is non-tender, soft  Musculoskeletal: Steady gait and movement  Skin: Cool and moist  Psychiatric: Normal affect, non-agitated, not confused       LABS:  n/a   RADS: CLINICAL DATA: Periumbilical pain. Evaluate for hernia.  EXAM: ULTRASOUND ABDOMEN LIMITED  COMPARISON: None.  FINDINGS: Focused ultrasound of the paraumbilical region demonstrates a tiny fat containing hernia just inferior to the umbilicus. No herniated bowel.  IMPRESSION: 1. Tiny fat containing paraumbilical hernia.   Electronically Signed By: Obie Dredge M.D. On: 02/15/2019 05:53  Assessment:       Umbilical hernia with gangrene and obstruction [K42.1]  Plan:    1. Umbilical hernia with gangrene and obstruction [Z61.1]  Discussed the risk of surgery including recurrence, which can be up to 50% in the case of incisional or complex hernias, possible use of prosthetic materials (mesh) and the increased risk of mesh infxn if used, bleeding, chronic pain, post-op infxn, post-op SBO or ileus, and possible re-operation to address said risks. The risks of general anesthetic, if used, includes MI, CVA, sudden death or even reaction to anesthetic medications also discussed. Alternatives include continued observation.  Benefits include possible symptom relief, prevention of incarceration, strangulation, enlargement in size over time, and the risk of emergency surgery in the face of strangulation.   Typical post-op recovery time of 3-5 days with 4-6 weeks of activity restrictions were also discussed.  ED return precautions given for sudden increase in pain, size of hernia with accompanying fever, nausea, and/or vomiting.  The patient verbalized understanding and all questions were answered to the patient's satisfaction.   2. Patient has elected to proceed with surgical treatment. Procedure will be urgently scheduled.       Electronically signed by Jasun Gasparini, DO on 02/17/2019 2:23 PM     

## 2019-02-17 NOTE — Op Note (Signed)
Preoperative diagnosis: umbilical hernia, strangulated Postoperative diagnosis: umbilical hernia,  incarcerated  Procedure:  Open umbilical hernia repair  Anesthesia: LMA  Surgeon: Sung Amabile  Wound Classification: Clean  Specimen: none  Complications: None  Estimated Blood Loss: minimal  Indications:see HPI  Findings: 1. 1cm x 1cm incarcerated umbilical hernia 2. Tension free repair achieved with suture 3. Adequate hemostasis  Description of procedure: The patient was brought to the operating room and general anesthesia was induced. A time-out was completed verifying correct patient, procedure, site, positioning, and implant(s) and/or special equipment prior to beginning this procedure. Antibiotics were administered prior to making the incision. SCDs placed. The anterior abdominal wall was prepped and draped in the standard sterile fashion.   An infraumbilical incision was made after infusing the preplanned incision with half percent Marcaine.  Dissection carried down to fascia where the umbilical stalk was noted.  The stalk was transected and there was noted to be a 1cm x 1cm umbilical hernia.  The incarcerated preperitoneal fat contents were dissected off the surrounding structures and reduced.  Hemostasis was confirmed prior to reducing the actual contents.  The defect itself was primary closed using 0 Ethibond  x3 in  simple interrupted  fashion.  The fascia as well as the skin incision was then infused with exparel.  After confirming hemostasis, the umbilical stalk was reattached to the abdominal wall using 2-0 Vicryl and the wound was irrigated and closed in a multilayer fashion, using 3-0 Vicryl for the deep dermal layer in an interrupted fashion and running 4-0 Monocryl in a subcuticular fashion.  Wound was then dressed with Dermabond.  Patient was then successfully awakened and transferred to PACU in stable condition.  At the end of the procedure sponge and instrument counts  were correct

## 2019-02-17 NOTE — Transfer of Care (Signed)
Immediate Anesthesia Transfer of Care Note  Patient: Kyle Clarke  Procedure(s) Performed: HERNIA REPAIR UMBILICAL ADULT (N/A )  Patient Location: PACU  Anesthesia Type:General  Level of Consciousness: awake, alert , oriented and patient cooperative  Airway & Oxygen Therapy: Patient Spontanous Breathing and Patient connected to face mask oxygen  Post-op Assessment: Report given to RN and Post -op Vital signs reviewed and stable  Post vital signs: Reviewed and stable  Last Vitals:  Vitals Value Taken Time  BP 134/85 02/17/2019  8:07 PM  Temp 37.1 C 02/17/2019  8:07 PM  Pulse 101 02/17/2019  8:09 PM  Resp 21 02/17/2019  8:09 PM  SpO2 100 % 02/17/2019  8:09 PM  Vitals shown include unvalidated device data.  Last Pain:  Vitals:   02/17/19 1614  TempSrc: Temporal  PainSc: 6          Complications: No apparent anesthesia complications

## 2019-02-17 NOTE — Anesthesia Post-op Follow-up Note (Signed)
Anesthesia QCDR form completed.        

## 2019-02-17 NOTE — Interval H&P Note (Signed)
History and Physical Interval Note:  02/17/2019 5:35 PM  Kyle Clarke  has presented today for surgery, with the diagnosis of umbilical hernia.  The various methods of treatment have been discussed with the patient and family. After consideration of risks, benefits and other options for treatment, the patient has consented to  Procedure(s): HERNIA REPAIR UMBILICAL ADULT (N/A) as a surgical intervention.  The patient's history has been reviewed, patient examined, no change in status, stable for surgery.  I have reviewed the patient's chart and labs.  Questions were answered to the patient's satisfaction.    No change in exam.  We discussed increased risk of covid exposure and if he is a asymptomatic carrier, and increased perioperative risk including respiratory decompensation.  He verbalized understanding and still wishes to proceed.  Chalmers Iddings Tonna Boehringer

## 2019-02-18 ENCOUNTER — Encounter: Payer: Self-pay | Admitting: Surgery

## 2019-03-03 ENCOUNTER — Other Ambulatory Visit: Payer: Self-pay | Admitting: Surgery

## 2019-03-03 ENCOUNTER — Other Ambulatory Visit (HOSPITAL_COMMUNITY): Payer: Self-pay | Admitting: Surgery

## 2019-03-03 DIAGNOSIS — R102 Pelvic and perineal pain: Secondary | ICD-10-CM

## 2019-03-12 ENCOUNTER — Other Ambulatory Visit: Payer: Self-pay | Admitting: Surgery

## 2019-03-12 ENCOUNTER — Other Ambulatory Visit: Payer: Self-pay

## 2019-03-12 ENCOUNTER — Ambulatory Visit
Admission: RE | Admit: 2019-03-12 | Discharge: 2019-03-12 | Disposition: A | Discharge status: Home / Self Care | Payer: BC Managed Care – PPO | Source: Ambulatory Visit | Attending: Surgery | Admitting: Surgery

## 2019-03-12 DIAGNOSIS — K429 Umbilical hernia without obstruction or gangrene: Secondary | ICD-10-CM

## 2019-03-12 DIAGNOSIS — R102 Pelvic and perineal pain: Secondary | ICD-10-CM | POA: Diagnosis not present

## 2019-07-31 IMAGING — US ULTRASOUND ABDOMEN LIMITED
1 series · 11 of 11 positions shown · non-contrast
Comparison: None.

CLINICAL DATA: Periumbilical pain.  Evaluate for hernia.

EXAM:
ULTRASOUND ABDOMEN LIMITED

[Series 1: ultrasound abdomen limited · 11 of 11 slices shown]
[im 1/11]
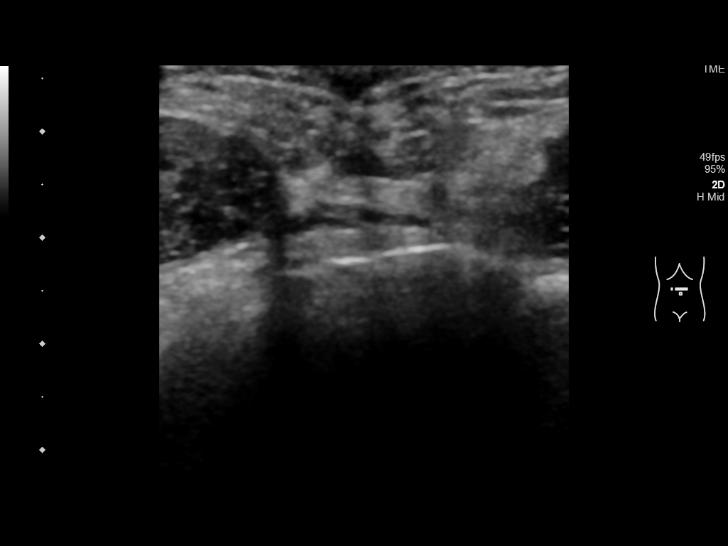
[im 2/11]
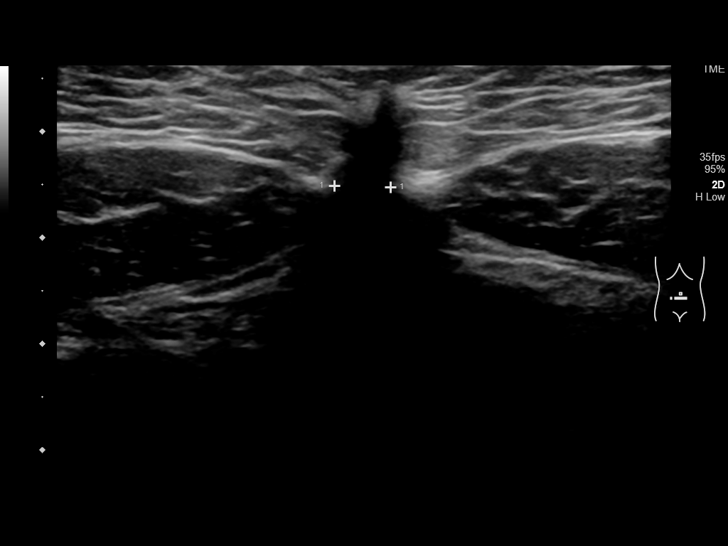
[im 3/11]
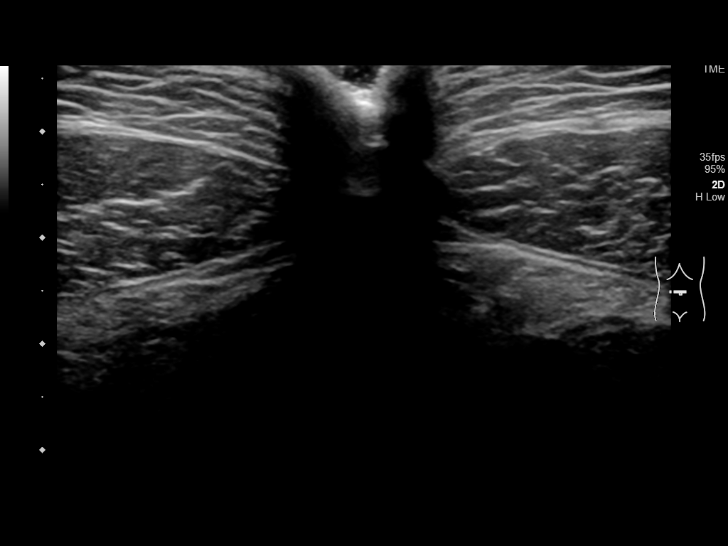
[im 4/11]
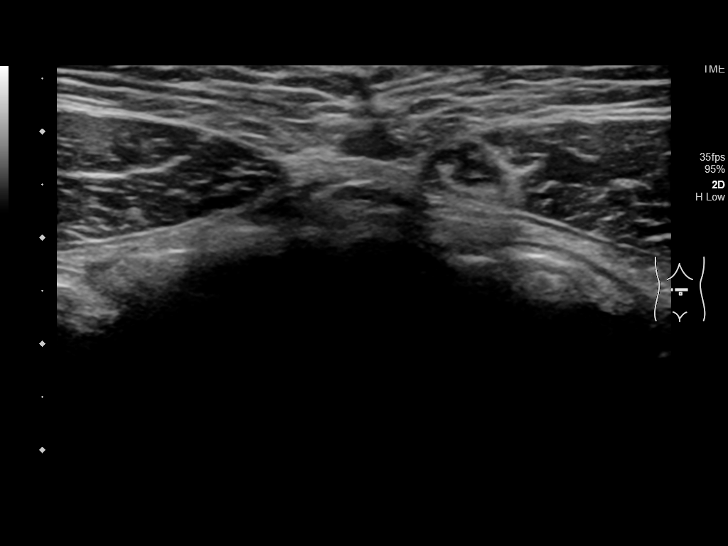
[im 5/11]
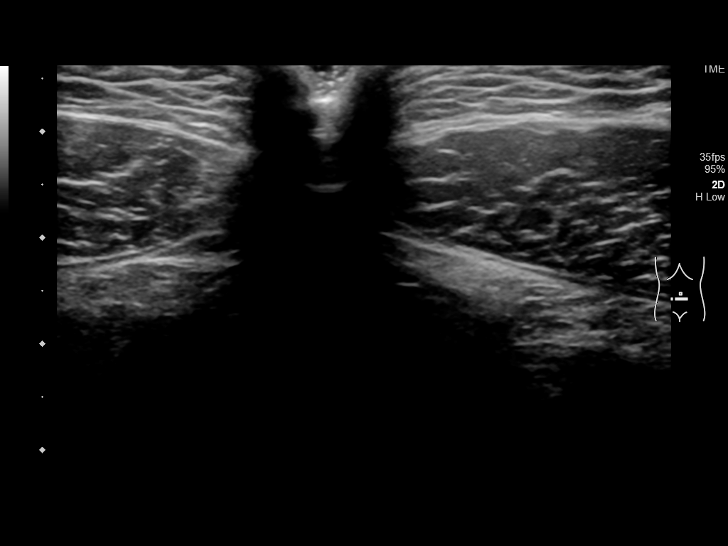
[im 6/11]
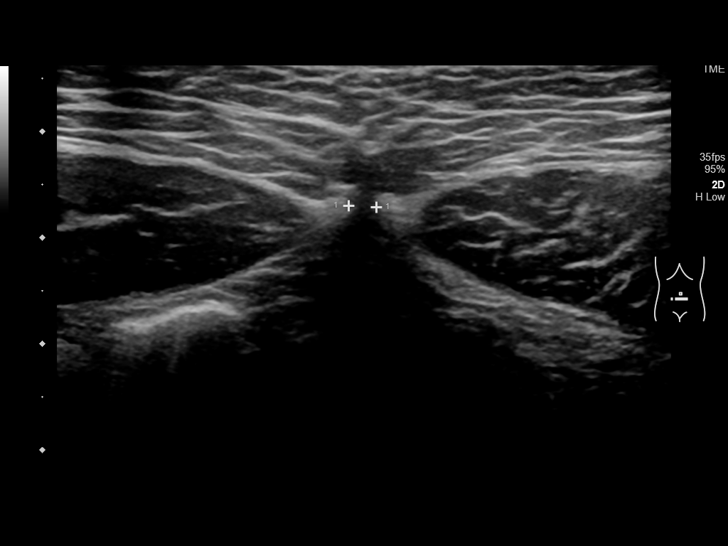
[im 7/11]
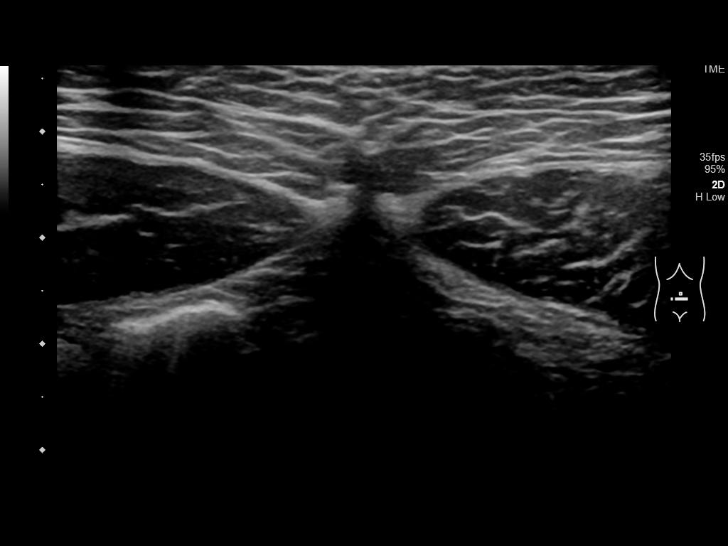
[im 8/11]
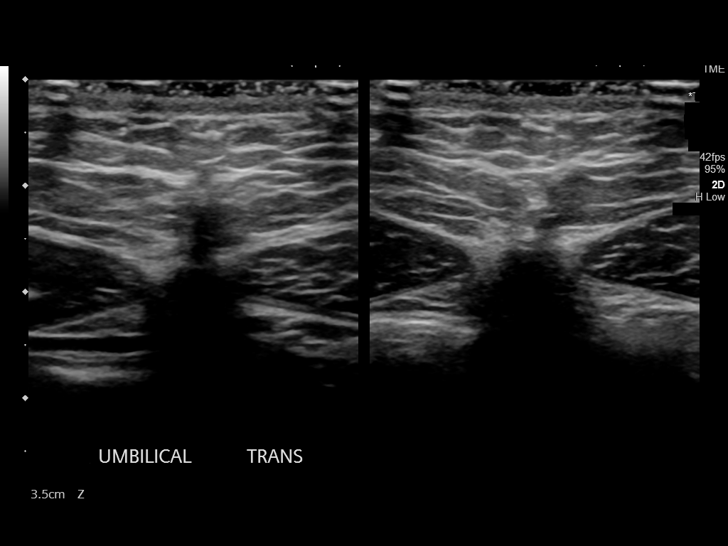
[im 9/11]
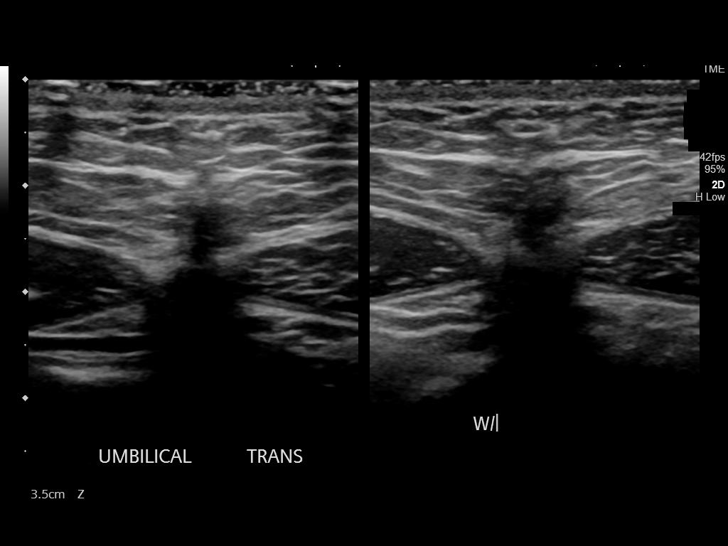
[im 10/11]
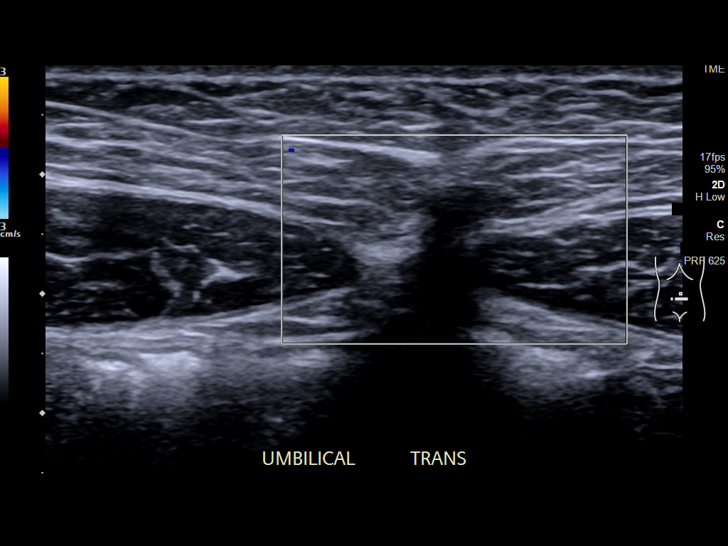
[im 11/11]
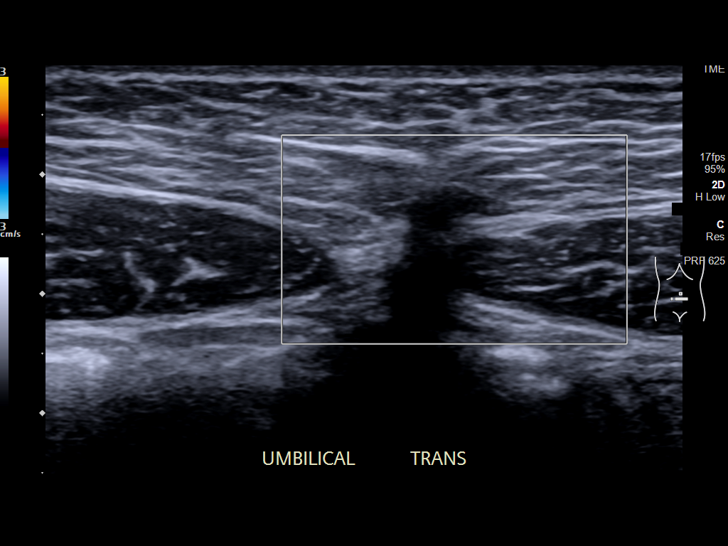

[11 of 11 positions shown; findings below may reference images not displayed]

FINDINGS: Focused ultrasound of the paraumbilical region demonstrates a tiny
fat containing hernia just inferior to the umbilicus. No herniated
bowel.
IMPRESSION: 1. Tiny fat containing paraumbilical hernia.

## 2019-08-25 IMAGING — US ULTRASOUND ABDOMEN LIMITED
1 series · 10 of 10 positions shown · non-contrast
Comparison: 02/15/2019

CLINICAL DATA: Postsurgical swelling, recent hernia repair

EXAM:
ULTRASOUND ABDOMEN LIMITED

[Series 1: ultrasound abdomen limited · 0.07mm/px · 10 acquisitions, 10 frames shown]
[im 1/10]
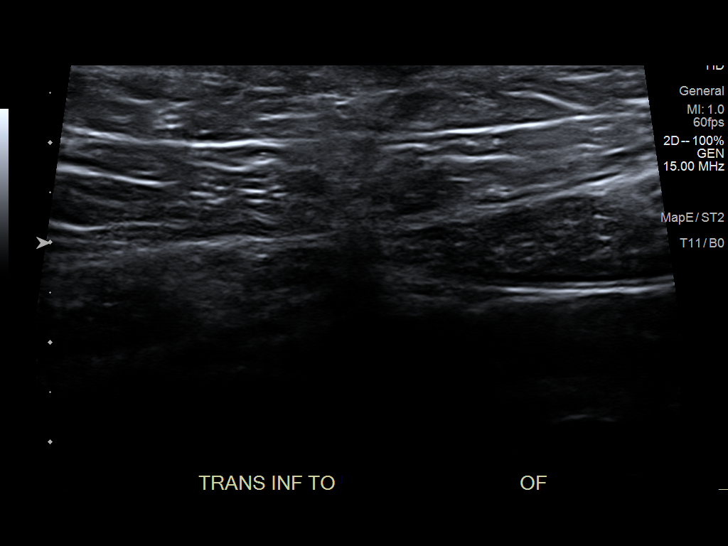
[im 2/10]
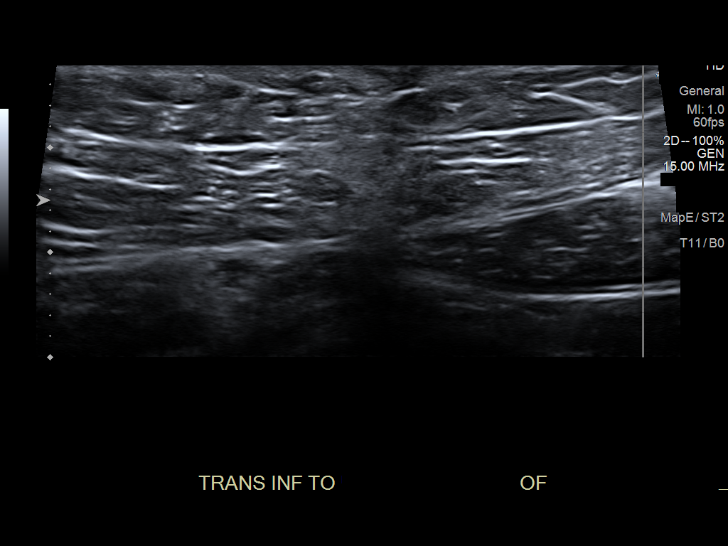
[im 3/10]
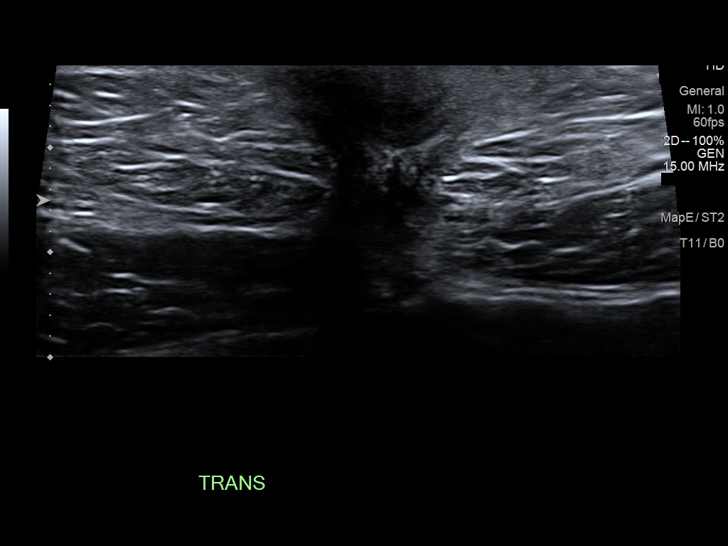
[im 4/10]
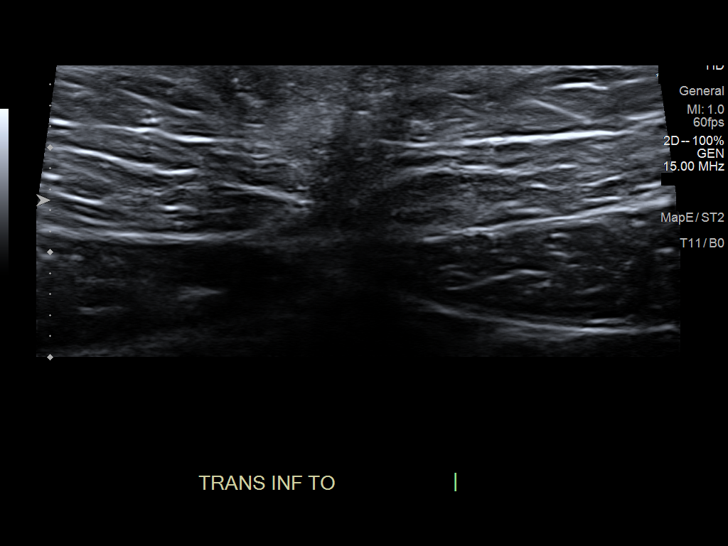
[im 5/10]
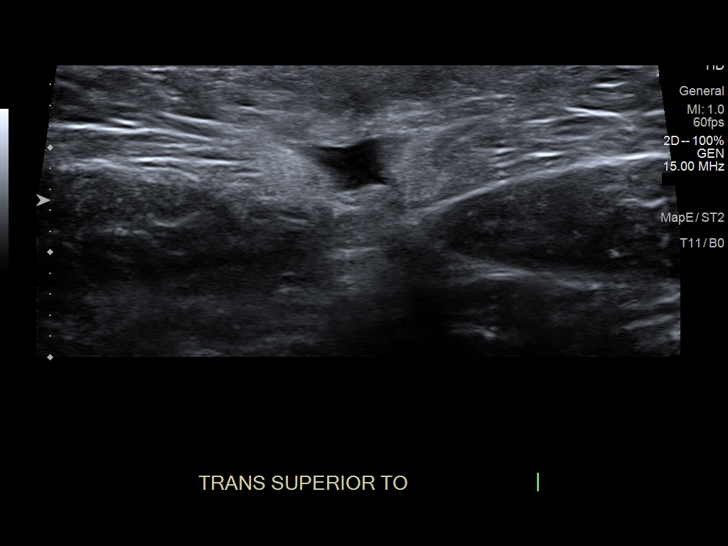
[im 6/10]
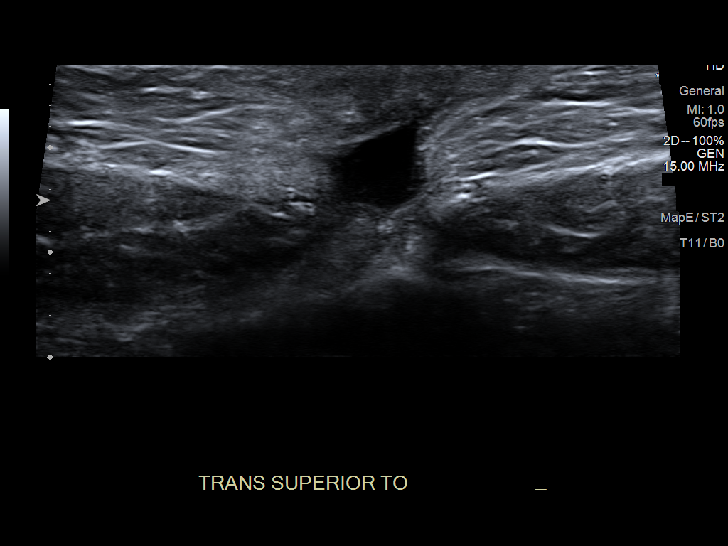
[im 7/10]
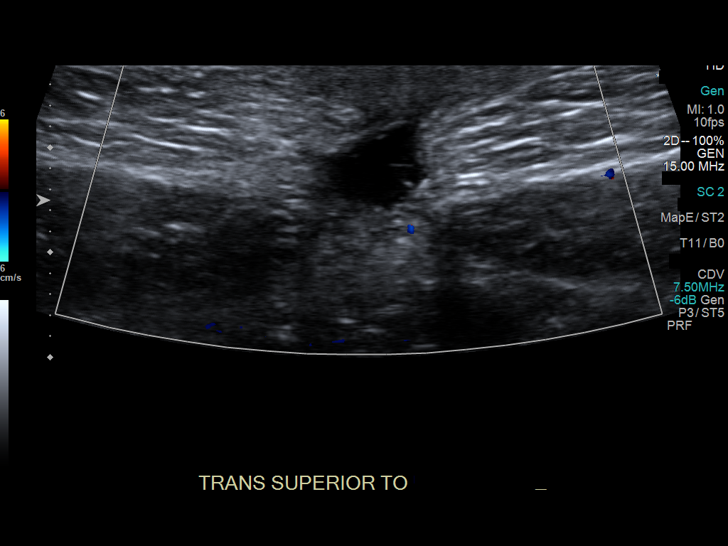
[im 8/10]
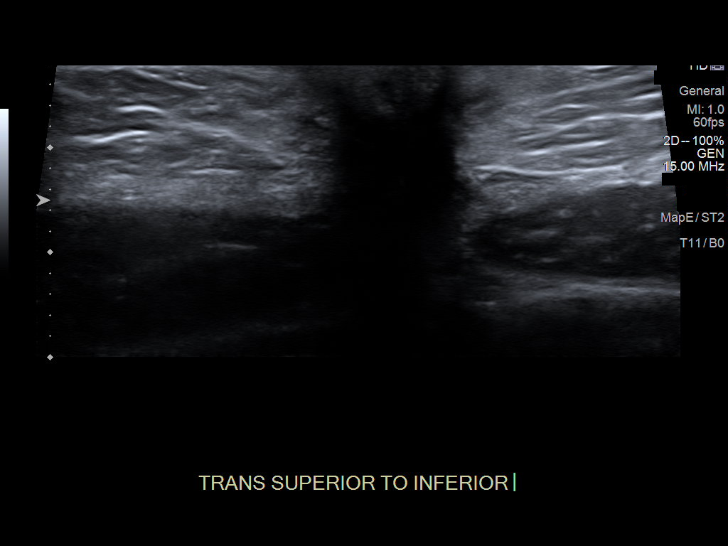
[im 9/10]
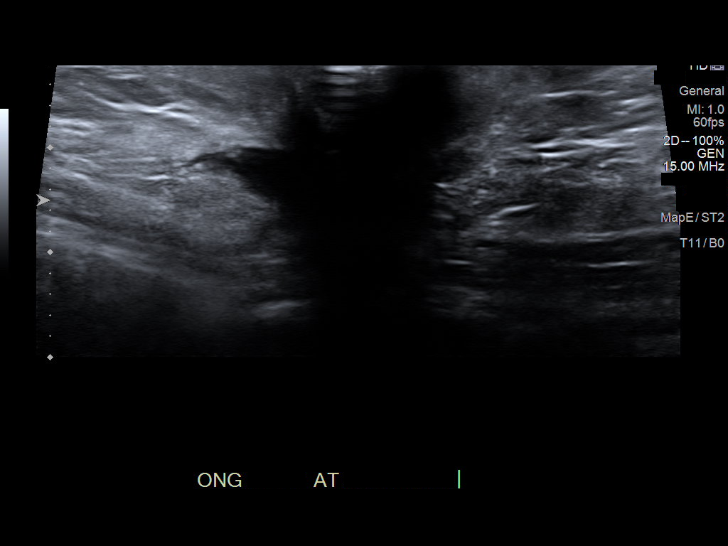
[im 10/10]
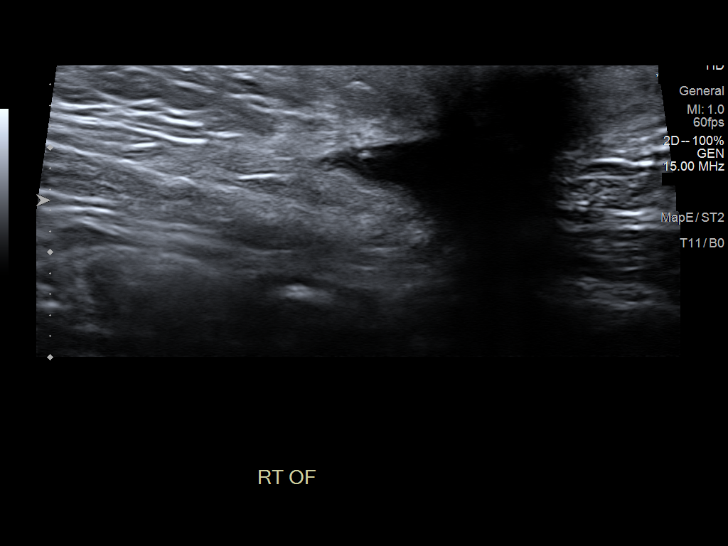

[10 of 10 positions shown; findings below may reference images not displayed]

FINDINGS: Targeted ultrasound of the region of concern is performed. This is
designated as inferior to the umbilicus at the site of hernia repair
surgery. In the region of surgical scar, there is a small fluid
collection with internal septa. This measures approximately 11 mm in
diameter.
IMPRESSION: Small fluid collection at the site of surgical repair which may
represent a postoperative fluid collection versus inflammatory or
infected fluid collection.

## 2019-08-25 IMAGING — US ULTRASOUND SCROTUM DOPPLER COMPLETE
1 series · 14 of 25 positions shown · non-contrast
Comparison: None.

CLINICAL DATA: Perineal pain

EXAM:
SCROTAL ULTRASOUND
DOPPLER ULTRASOUND OF THE TESTICLES
TECHNIQUE: Complete ultrasound examination of the testicles, epididymis, and
other scrotal structures was performed. Color and spectral Doppler
ultrasound were also utilized to evaluate blood flow to the
testicles.

[Series 1: ultrasound scrotum doppler complete · 0.07mm/px · 14 of 56 slices shown]
[im 1/56]
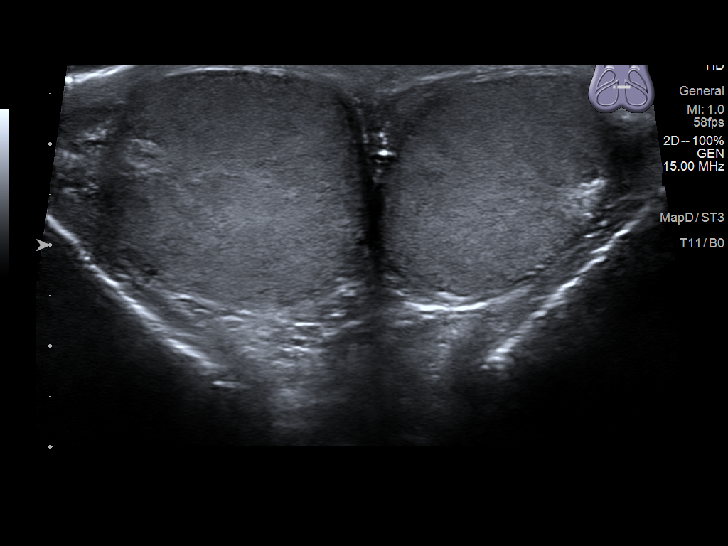
[im 5/56]
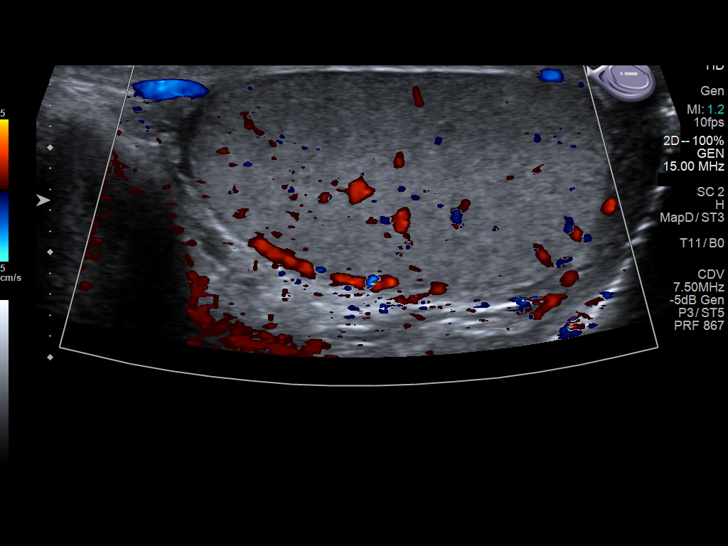
[im 10/56]
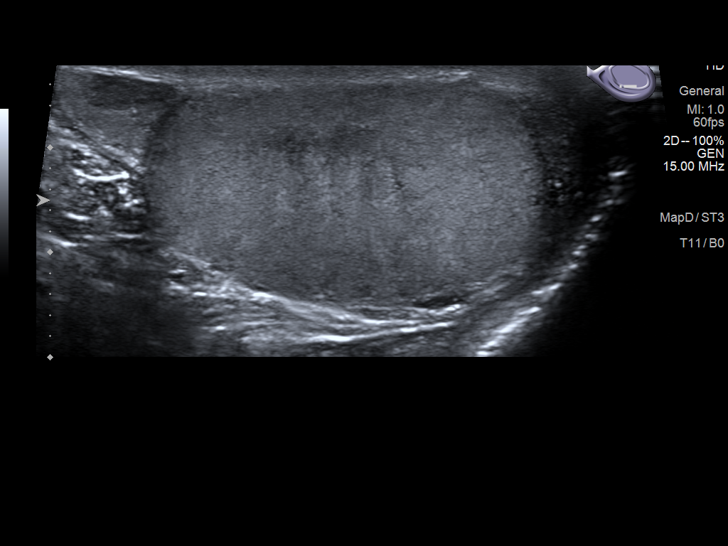
[im 14/56]
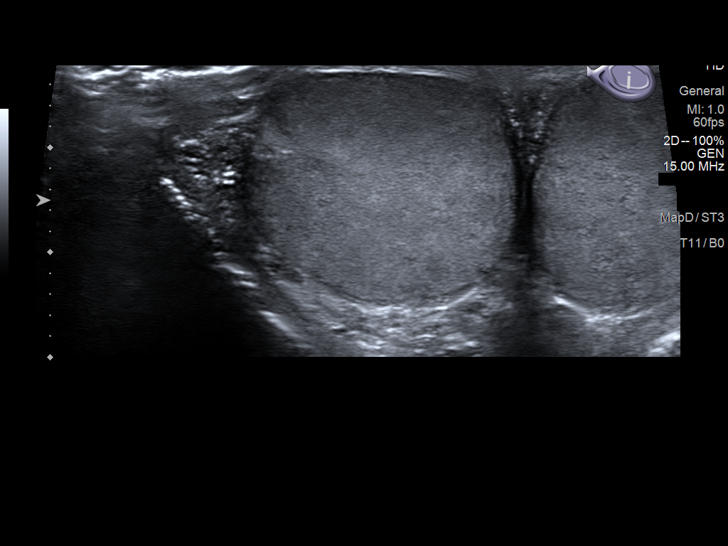
[im 19/56]
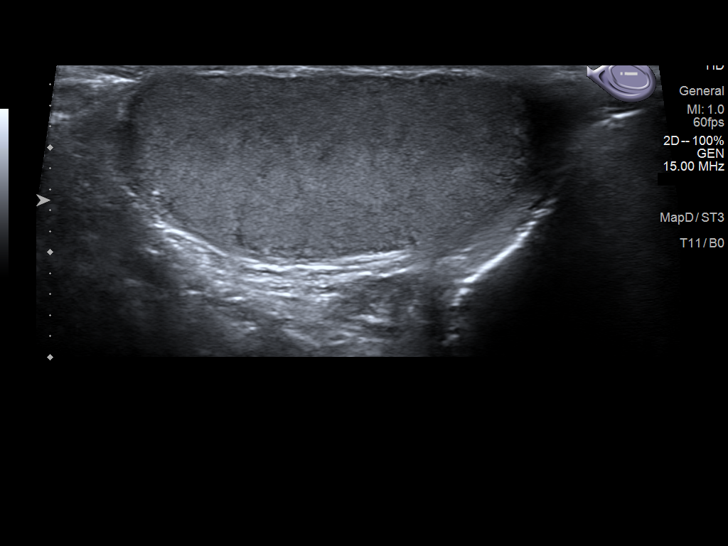
[im 21/56]
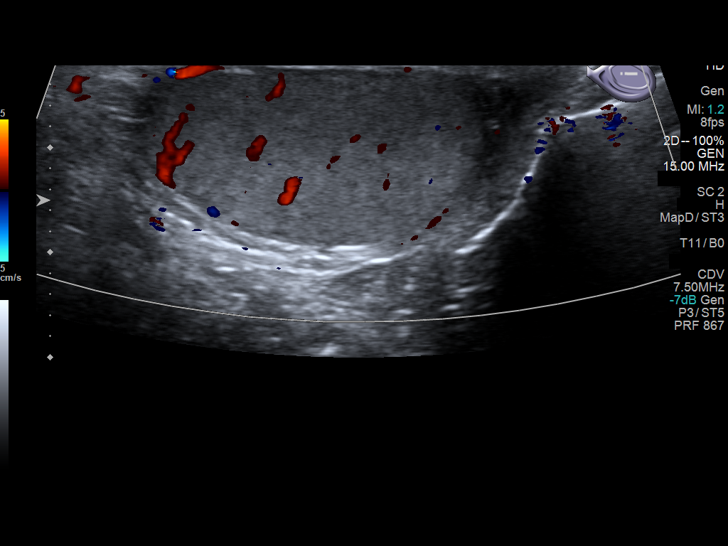
[im 26/56]
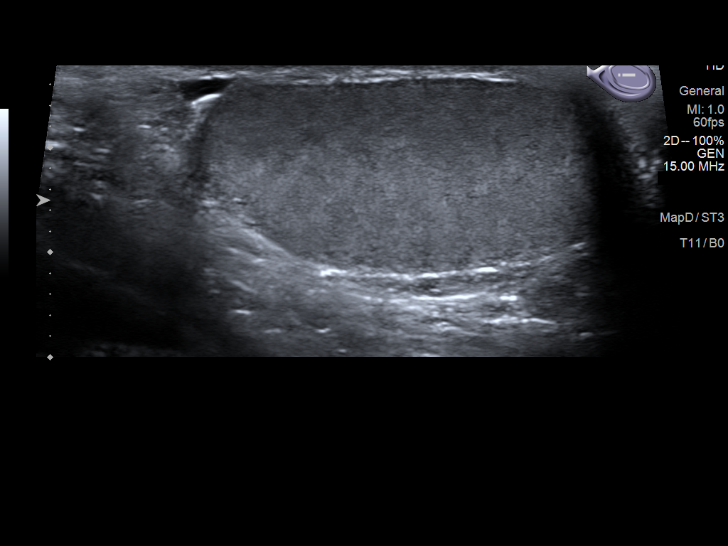
[im 30/56]
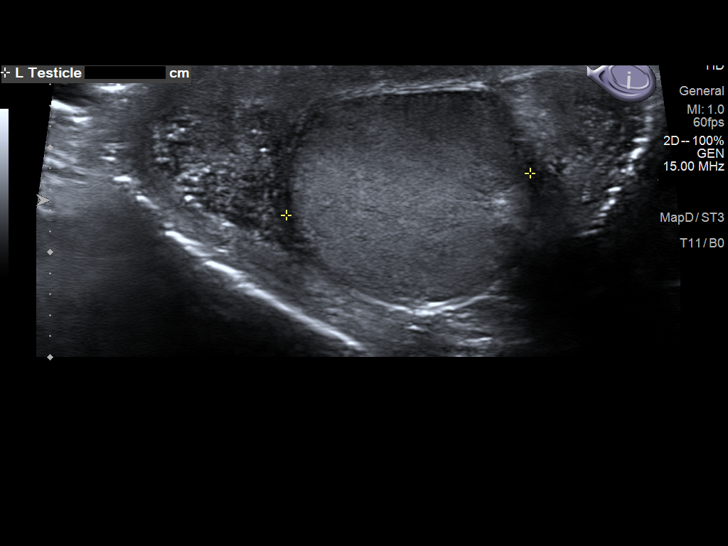
[im 35/56]
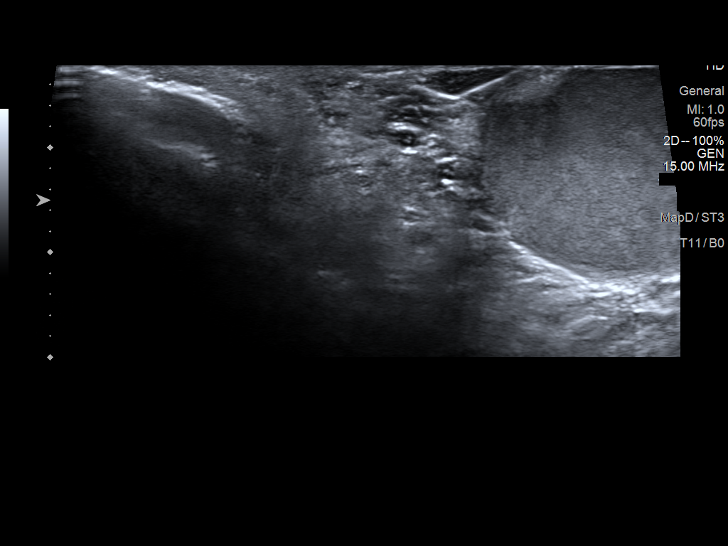
[im 37/56]
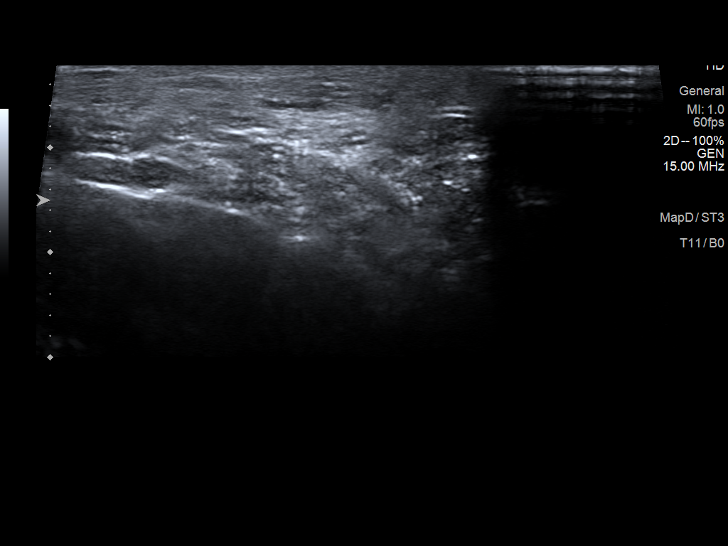
[im 42/56]
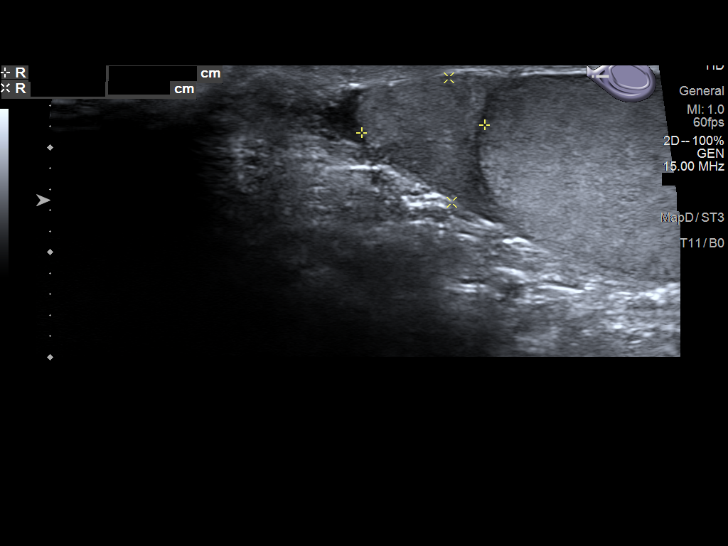
[im 46/56]
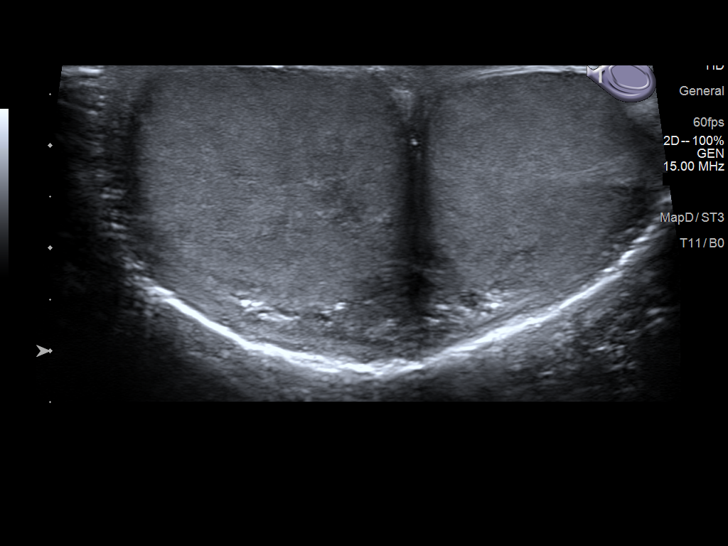
[im 51/56]
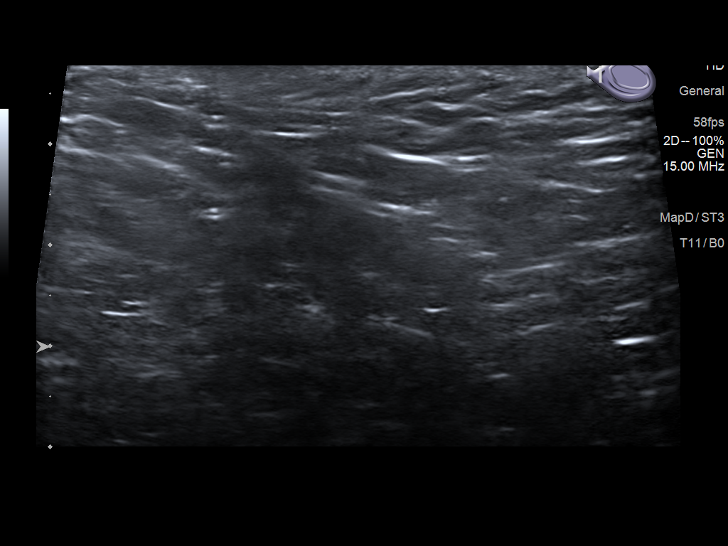
[im 56/56]
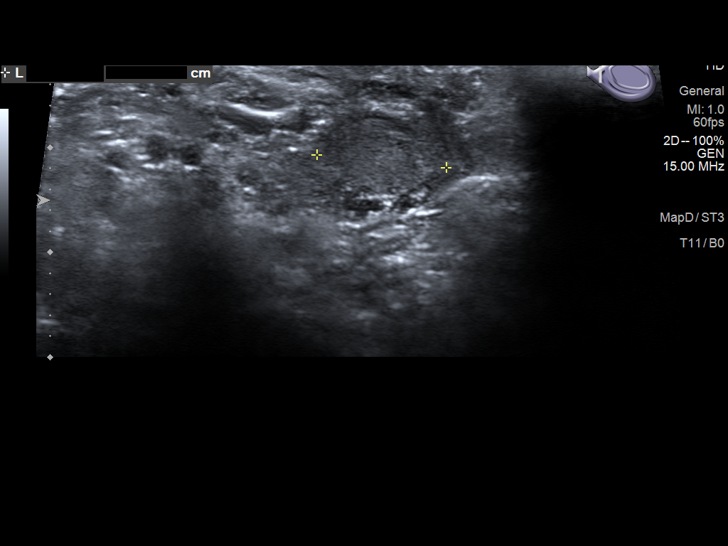

[14 of 25 positions shown; findings below may reference images not displayed]

FINDINGS: Right testicle

Measurements: 4 x 2.3 x 2.6 cm. No mass or microlithiasis
visualized.

Left testicle

Measurements: 3.8 x 1.9 x 2.4 cm. No mass or microlithiasis
visualized.

Right epididymis:  Normal in size and appearance.

Left epididymis:  Normal in size and appearance.

Hydrocele:  None visualized.

Varicocele:  None visualized.

Pulsed Doppler interrogation of both testes demonstrates normal low
resistance arterial and venous waveforms bilaterally.
IMPRESSION: Negative scrotal ultrasound

## 2022-09-04 ENCOUNTER — Ambulatory Visit: Payer: BC Managed Care – PPO | Admitting: Family Medicine

## 2022-09-04 VITALS — BP 130/84 | Ht 72.0 in | Wt 145.0 lb

## 2022-09-04 DIAGNOSIS — M25571 Pain in right ankle and joints of right foot: Secondary | ICD-10-CM | POA: Diagnosis not present

## 2022-09-04 DIAGNOSIS — M545 Low back pain, unspecified: Secondary | ICD-10-CM | POA: Diagnosis not present

## 2022-09-04 NOTE — Progress Notes (Signed)
  SUBJECTIVE:   Chief Complaint: Left foot   History of Presenting Illness:   Kyle Clarke is a 21 year old male with a past medical history of R peroneal tenosynovitis, periumbilical hernia, hypertension, and tachycardia who presents with chronic left foot pain and back discomfort.  States that he has been experiencing a sharp shooting pain on the side of his left foot for a couple months. The pain comes and goes, worsening when the foot is inverted and pressure is applied to the bottom of it. Pain is located below malleolus and sometimes radiates toward toes. Exacerbated by running, but not other activities including walking. He has tried foam rolling, which has helped a little. Has not tried any medications, PT, ice, or compression. No prior imaging studies.  Also reports back pain. Tightness started several weeks ago and has progressed to pain over the last week. History notable for a periumbilical hernia repair several years ago. Discomfort involves entire length of spine, often concurrent with umbilical pain at surgical site. It is exacerbated by prolonged periods of sitting. Due to his foot pain, he has been running less frequency recently and believes that this is creating tightness in his back. Foam rolling helps but only temporarily. Heating pad only helps a little. Tried naproxen last night, not sure if it helped. Has not tried PT or ice.   Pertinent Medical History:   R peroneal tenosynovitis Periumbilical hernia repair   Past Medical History:  Diagnosis Date   Umbilical hernia      OBJECTIVE:   BP 130/84   Ht 6' (1.829 m)   Wt 145 lb (65.8 kg)   BMI 19.67 kg/m   Physical Exam   No tenderness to palpation in L foot including lateral aspect below malleolus, strength 5/5 with inversion-eversion-flexion-extension, sensation to light touch intact bilaterally, no pain against resistance with either eversion or plantarflexion, no erythema or swelling  Paraspinal and gluteal  tightness-tenderness, no radiation into legs   ASSESSMENT/PLAN:   Presentation is consistent with mild peroneus brevis tendinitis. Prescribed home exercises to be performed 3-5 days per week. If pain persists, then consider trial of lateral posts to limit degree of supination. Recommend icing and topical diclofenac as needed.  Encouraged abdominal, oblique, and lower back strengthening exercises to reduce stress on back muscles thereby reducing pain. Perform 3-5 days per week consistently for about six weeks.  Follow-up in about one month.    There are no diagnoses linked to this encounter.  No follow-ups on file.  Lajuana Ripple, MD  09/04/2022, 3:10 PM    Lajuana Ripple, MD Internal Medicine PGY-1

## 2022-09-04 NOTE — Patient Instructions (Signed)
You have mild peroneal tendinitis of your peroneus brevis. Do home exercises as directed most days of the week. Consider lateral posts if this does not improve just with the rehab exercises. Icing if needed, voltaren gel if needed.  For your back, core strengthening is important to work obliques, abdominals, and low back muscles. Do most days of the week for the next 6 weeks also.  Follow up with me in 1 month.

## 2022-09-05 ENCOUNTER — Encounter: Payer: Self-pay | Admitting: Family Medicine

## 2023-03-06 ENCOUNTER — Other Ambulatory Visit: Payer: Self-pay | Admitting: Surgery

## 2023-03-06 DIAGNOSIS — R1033 Periumbilical pain: Secondary | ICD-10-CM

## 2023-03-16 ENCOUNTER — Ambulatory Visit: Admission: RE | Admit: 2023-03-16 | Payer: BC Managed Care – PPO | Source: Ambulatory Visit
